# Patient Record
Sex: Female | Born: 1990
Health system: Southern US, Community
[De-identification: ages and names within clinical notes are randomized; demographics above are authoritative.]

## PROBLEM LIST (undated history)

## (undated) DIAGNOSIS — E282 Polycystic ovarian syndrome: Secondary | ICD-10-CM

## (undated) HISTORY — PX: CHOLECYSTECTOMY: SHX55

## (undated) HISTORY — DX: Polycystic ovarian syndrome: E28.2

---

## 2016-05-11 ENCOUNTER — Other Ambulatory Visit: Payer: Self-pay | Admitting: Obstetrics & Gynecology

## 2016-05-11 ENCOUNTER — Other Ambulatory Visit (HOSPITAL_COMMUNITY)
Admission: RE | Admit: 2016-05-11 | Discharge: 2016-05-11 | Disposition: A | Discharge status: Home / Self Care | Payer: BLUE CROSS/BLUE SHIELD | Source: Ambulatory Visit | Attending: Obstetrics and Gynecology | Admitting: Obstetrics and Gynecology

## 2016-05-11 DIAGNOSIS — Z01411 Encounter for gynecological examination (general) (routine) with abnormal findings: Secondary | ICD-10-CM | POA: Diagnosis present

## 2016-05-14 LAB — CYTOLOGY - PAP

## 2016-07-02 ENCOUNTER — Encounter: Payer: Self-pay | Admitting: *Deleted

## 2016-07-02 ENCOUNTER — Encounter: Payer: BLUE CROSS/BLUE SHIELD | Attending: Obstetrics & Gynecology | Admitting: *Deleted

## 2016-07-02 DIAGNOSIS — E282 Polycystic ovarian syndrome: Secondary | ICD-10-CM | POA: Insufficient documentation

## 2016-07-02 NOTE — Progress Notes (Signed)
  Medical Nutrition Therapy:  Appt start time: 1030 end time:  1130.   Assessment:  Primary concerns today: Ariel Nelson is here for nutrition counseling pertaining to referral for obesity and PCOS.  Ariel Nelson states she doesn't have any specific concerns as she has been a "successful dieter" in the past.  However, feels like she is a "stress eater" and gains weight when she is engaged in those behaviors.  Is able to "lose weight when she wants to."  Has never been to a dietitian before.  Has PCOS and was recommended to decrease carbs, but doesn't have much understanding about the disorder.   She does the grocery shopping sometimes.  She lives with her parents, who are Hispanic and mom does the cooking.  Mom typically fries food. 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  She doesn't eat meat much, but eats most other foods (except tomatoes and celery).  When at home, she typically ate at her desk while watching tv or doing homework, but is trying to eat more at the table without distractions. She can be a fast eater.   Craves carbs.     Preferred Learning Style:   No preference indicated   Learning Readiness:   Contemplating   MEDICATIONS: OCP and Synthroid.   No Metformin prescribed   DIETARY INTAKE:  Usual eating pattern includes 2 meals and 2-3 snacks per day. 24-hr recall:  B ( AM): typically skips.  Sometimes 2 slices ww bread and cheese with antibiotics lately.   Snk ( AM): none  L ( PM): grilled chicken breast with vegetables Snk ( PM): cucumbers  D ( PM): salad, sometimes chicken Snk ( PM): none Beverages: water, doesn't like soda  Usual physical activity: not much currently- is in school currently  (but classes are online    Nutritional Diagnosis:  [place]-2.1 Inpaired nutrition utilization As related to carbohydrates.  As evidenced by PCOS.    Intervention:  Nutrition counseling provided.  Discussed physiology of carbohydrate metabolism and how it is affected by  PCOS.  Discussed hormonal imbalances associated with PCOS (namely insulin resistance) and how those imbalances present themselves with hirsutism, body acne, menstrual irregularity, weight gain, and poor glycemic control.  Dicussed the importance of nutrition management for overall health.   Discussed HAES approach and stressed importance of adequate nutrition.  Discussed metabolic effects of inadequate nutrition and how weight management is related to hormone levels, not necessarily lifestyle behaviors.  Recommended talking with medical provider about adequate medication treatment of PCOS.  Mentioned supplemental treatment with DHA.  Recommended adequate calories and adequate protein.  Discussed importance of self-care, including sleep hygiene and gentle body movement   Teaching Method Utilized: Auditory   Handouts given during visit include:  Food Peace and PCOS  Barriers to learning/adherence to lifestyle change: living with parents, time for exercise  Demonstrated degree of understanding via:  Teach Back   Monitoring/Evaluation:  Dietary intake, exercise, labs, and body weight prn.

## 2016-07-02 NOTE — Patient Instructions (Signed)
Aim for 3 meals each day and avoid meal skipping Increase protein with all meals Aim for adequate sleep Increase movement (walking and maybe weight lifting) Talk to doctor about Metformin.  Take with food,  Start at low dose and increase very slowly (over 2 week).  May need very high dose in the end..  Also ask about Spinolactone for hair Try the fish oil Increase fiber from whole grains, vegetables, fruits, beans, nuts, etc

## 2018-05-12 DIAGNOSIS — L2084 Intrinsic (allergic) eczema: Secondary | ICD-10-CM | POA: Diagnosis not present

## 2018-05-12 DIAGNOSIS — D2239 Melanocytic nevi of other parts of face: Secondary | ICD-10-CM | POA: Diagnosis not present

## 2018-05-12 DIAGNOSIS — L304 Erythema intertrigo: Secondary | ICD-10-CM | POA: Diagnosis not present

## 2018-07-18 DIAGNOSIS — K219 Gastro-esophageal reflux disease without esophagitis: Secondary | ICD-10-CM | POA: Diagnosis not present

## 2018-09-15 DIAGNOSIS — E282 Polycystic ovarian syndrome: Secondary | ICD-10-CM | POA: Diagnosis not present

## 2018-09-15 DIAGNOSIS — L659 Nonscarring hair loss, unspecified: Secondary | ICD-10-CM | POA: Diagnosis not present

## 2018-09-15 DIAGNOSIS — N9489 Other specified conditions associated with female genital organs and menstrual cycle: Secondary | ICD-10-CM | POA: Diagnosis not present

## 2018-11-18 DIAGNOSIS — R300 Dysuria: Secondary | ICD-10-CM | POA: Diagnosis not present

## 2018-11-18 DIAGNOSIS — N3001 Acute cystitis with hematuria: Secondary | ICD-10-CM | POA: Diagnosis not present

## 2018-12-16 DIAGNOSIS — N3000 Acute cystitis without hematuria: Secondary | ICD-10-CM | POA: Diagnosis not present

## 2018-12-16 DIAGNOSIS — B373 Candidiasis of vulva and vagina: Secondary | ICD-10-CM | POA: Diagnosis not present

## 2019-02-13 ENCOUNTER — Encounter (HOSPITAL_COMMUNITY): Payer: Self-pay | Admitting: Emergency Medicine

## 2019-02-13 ENCOUNTER — Other Ambulatory Visit: Payer: Self-pay

## 2019-02-13 ENCOUNTER — Ambulatory Visit (HOSPITAL_COMMUNITY)
Admission: EM | Admit: 2019-02-13 | Discharge: 2019-02-13 | Disposition: A | Discharge status: Home / Self Care | Payer: BLUE CROSS/BLUE SHIELD | Attending: Family Medicine | Admitting: Family Medicine

## 2019-02-13 ENCOUNTER — Ambulatory Visit (INDEPENDENT_AMBULATORY_CARE_PROVIDER_SITE_OTHER): Payer: BLUE CROSS/BLUE SHIELD

## 2019-02-13 DIAGNOSIS — X501XXA Overexertion from prolonged static or awkward postures, initial encounter: Secondary | ICD-10-CM | POA: Diagnosis not present

## 2019-02-13 DIAGNOSIS — W108XXA Fall (on) (from) other stairs and steps, initial encounter: Secondary | ICD-10-CM

## 2019-02-13 DIAGNOSIS — S99912A Unspecified injury of left ankle, initial encounter: Secondary | ICD-10-CM

## 2019-02-13 DIAGNOSIS — S8255XA Nondisplaced fracture of medial malleolus of left tibia, initial encounter for closed fracture: Secondary | ICD-10-CM

## 2019-02-13 NOTE — ED Provider Notes (Addendum)
[place]    CSN: [id number] Arrival date & time: 02/13/19  1714     History   Chief Complaint Chief Complaint  Patient presents with  . Ankle Pain    HPI Ariel Nelson is a 28 y.o. female.    The patient presented to the [facility] with a complaint of left ankle pain and swelling secondary to rolling her ankle walking down steps earlier today.  Patient heard a "pop" when ankle twisted and she has pain medially  Pain is not severe     Past Medical History:  Diagnosis Date  . PCOS (polycystic ovarian syndrome)     There are no active problems to display for this patient.   Past Surgical History:  Procedure Laterality Date  . CHOLECYSTECTOMY      OB History   No obstetric history on file.      Home Medications    Prior to Admission medications   Medication Sig Start Date End Date Taking? Authorizing Provider  drospirenone-ethinyl estradiol (YAZ,GIANVI,LORYNA) 3-0.02 MG tablet Take 1 tablet by mouth daily.   Yes [provider]  levothyroxine (SYNTHROID, LEVOTHROID) 100 MCG tablet Take 100 mcg by mouth daily before breakfast.   Yes [provider]    Family History Family History  Problem Relation Age of Onset  . Diabetes Father   . Hypertension Other   . Cancer Other     Social History Social History   Tobacco Use  . Smoking status: Not on file  Substance Use Topics  . Alcohol use: Not on file  . Drug use: Not on file     Allergies   Levaquin [levofloxacin]   Review of Systems Review of Systems   Physical Exam Triage Vital Signs ED Triage Vitals  Enc Vitals Group     BP 02/13/19 1757 (!) 152/69     Pulse Rate 02/13/19 1757 (!) 101     Resp 02/13/19 1757 20     Temp 02/13/19 1757 98.2 F (36.8 C)     Temp Source 02/13/19 1757 Oral     SpO2 02/13/19 1757 99 %     Weight --      Height --      Head Circumference --      Peak Flow --      Pain Score 02/13/19 1759 2     Pain Loc --      Pain Edu? --    Excl. in [address]? --    No data found.  Updated Vital Signs BP (!) 152/69 (BP Location: Left Arm)   Pulse (!) 101   Temp 98.2 F (36.8 C) (Oral)   Resp 20   LMP 01/17/2019 (Exact Date)   SpO2 99%    Physical Exam Vitals signs and nursing note reviewed.  Constitutional:      Appearance: Normal appearance. She is obese.  HENT:     Head: Normocephalic and atraumatic.  Pulmonary:     Effort: Pulmonary effort is normal.  Musculoskeletal:        General: Swelling present.     Comments: Tender and swollen, ecchymotic medial malleolus of left ankle  Skin:    General: Skin is warm and dry.  Neurological:     General: No focal deficit present.     Mental Status: She is alert.  Psychiatric:        Mood and Affect: Mood normal.      UC Treatments / Results  Labs (all labs ordered are listed,  but only abnormal results are displayed) Labs Reviewed - No data to display  EKG None  Radiology No results found.  Procedures Procedures (including critical care time)  Medications Ordered in UC Medications - No data to display  Initial Impression / Assessment and Plan / UC Course  I have reviewed the triage vital signs and the nursing notes.  Pertinent labs & imaging results that were available during my care of the patient were reviewed by me and considered in my medical decision making (see chart for details).    Final Clinical Impressions(s) / UC Diagnoses   Final diagnoses:  Closed nondisplaced fracture of medial malleolus of left tibia, initial encounter     Discharge Instructions     Follow up with your orthopedic surgeon next week  Keep the Cam walker on whenever walking or bearing weight.    ED Prescriptions    None     Controlled Substance Prescriptions [place] Controlled Substance Registry consulted? Not Applicable   Robyn Haber, MD 02/13/19 De Nurse, MD 02/13/19 1900

## 2019-02-13 NOTE — Discharge Instructions (Addendum)
Follow up with your orthopedic surgeon next week  Keep the Cam walker on whenever walking or bearing weight.

## 2019-02-13 NOTE — ED Triage Notes (Signed)
The patient presented to the [facility] with a complaint of left ankle pain and swelling secondary to rolling her ankle walking down steps earlier today.

## 2019-10-14 ENCOUNTER — Other Ambulatory Visit: Payer: Self-pay

## 2019-10-14 DIAGNOSIS — Z20822 Contact with and (suspected) exposure to covid-19: Secondary | ICD-10-CM

## 2019-10-15 LAB — NOVEL CORONAVIRUS, NAA: SARS-CoV-2, NAA: NOT DETECTED

## 2022-02-08 ENCOUNTER — Other Ambulatory Visit: Payer: Self-pay | Admitting: Family Medicine

## 2022-02-08 DIAGNOSIS — R7401 Elevation of levels of liver transaminase levels: Secondary | ICD-10-CM

## 2022-03-20 ENCOUNTER — Ambulatory Visit
Admission: RE | Admit: 2022-03-20 | Discharge: 2022-03-20 | Disposition: A | Discharge status: Home / Self Care | Payer: Managed Care, Other (non HMO) | Source: Ambulatory Visit | Attending: Family Medicine | Admitting: Family Medicine

## 2022-03-20 DIAGNOSIS — R7401 Elevation of levels of liver transaminase levels: Secondary | ICD-10-CM

## 2022-06-22 DIAGNOSIS — E559 Vitamin D deficiency, unspecified: Secondary | ICD-10-CM | POA: Diagnosis not present

## 2022-06-22 DIAGNOSIS — N760 Acute vaginitis: Secondary | ICD-10-CM | POA: Diagnosis not present

## 2022-06-22 DIAGNOSIS — Z113 Encounter for screening for infections with a predominantly sexual mode of transmission: Secondary | ICD-10-CM | POA: Diagnosis not present

## 2022-06-25 DIAGNOSIS — H33321 Round hole, right eye: Secondary | ICD-10-CM | POA: Diagnosis not present

## 2022-07-18 DIAGNOSIS — H31091 Other chorioretinal scars, right eye: Secondary | ICD-10-CM | POA: Diagnosis not present

## 2022-07-18 DIAGNOSIS — H35411 Lattice degeneration of retina, right eye: Secondary | ICD-10-CM | POA: Diagnosis not present

## 2022-08-03 DIAGNOSIS — M25561 Pain in right knee: Secondary | ICD-10-CM | POA: Diagnosis not present

## 2022-09-28 DIAGNOSIS — E782 Mixed hyperlipidemia: Secondary | ICD-10-CM | POA: Diagnosis not present

## 2022-09-28 DIAGNOSIS — E039 Hypothyroidism, unspecified: Secondary | ICD-10-CM | POA: Diagnosis not present

## 2022-09-28 DIAGNOSIS — E559 Vitamin D deficiency, unspecified: Secondary | ICD-10-CM | POA: Diagnosis not present

## 2022-09-28 DIAGNOSIS — E8881 Metabolic syndrome: Secondary | ICD-10-CM | POA: Diagnosis not present

## 2022-09-28 DIAGNOSIS — Z0000 Encounter for general adult medical examination without abnormal findings: Secondary | ICD-10-CM | POA: Diagnosis not present

## 2023-02-19 DIAGNOSIS — M9420 Chondromalacia, unspecified site: Secondary | ICD-10-CM | POA: Diagnosis not present

## 2023-02-19 DIAGNOSIS — E039 Hypothyroidism, unspecified: Secondary | ICD-10-CM | POA: Diagnosis not present

## 2023-02-19 DIAGNOSIS — M5430 Sciatica, unspecified side: Secondary | ICD-10-CM | POA: Diagnosis not present

## 2023-02-19 DIAGNOSIS — L259 Unspecified contact dermatitis, unspecified cause: Secondary | ICD-10-CM | POA: Diagnosis not present

## 2023-02-19 DIAGNOSIS — M2550 Pain in unspecified joint: Secondary | ICD-10-CM | POA: Diagnosis not present

## 2023-03-15 DIAGNOSIS — Z113 Encounter for screening for infections with a predominantly sexual mode of transmission: Secondary | ICD-10-CM | POA: Diagnosis not present

## 2023-03-15 DIAGNOSIS — Z23 Encounter for immunization: Secondary | ICD-10-CM | POA: Diagnosis not present

## 2023-03-15 DIAGNOSIS — Z01419 Encounter for gynecological examination (general) (routine) without abnormal findings: Secondary | ICD-10-CM | POA: Diagnosis not present

## 2023-05-17 DIAGNOSIS — Z23 Encounter for immunization: Secondary | ICD-10-CM | POA: Diagnosis not present

## 2023-08-25 IMAGING — US US ABDOMEN LIMITED
1 series · 14 of 25 positions shown · non-contrast
Comparison: None.

CLINICAL DATA: Elevated transaminase level. Status post
cholecystectomy.

EXAM:
ULTRASOUND ABDOMEN LIMITED RIGHT UPPER QUADRANT

[Series 1: us abdomen limited · 0.28mm/px · 14 of 32 slices shown]
[im 1/32]
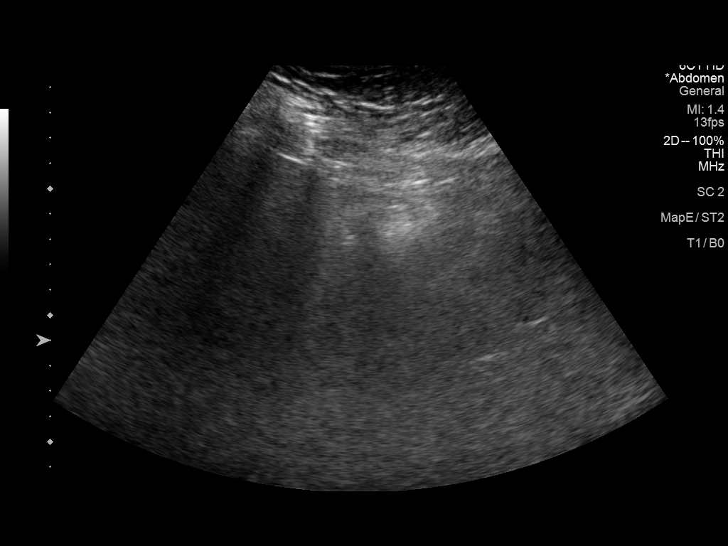
[im 3/32]
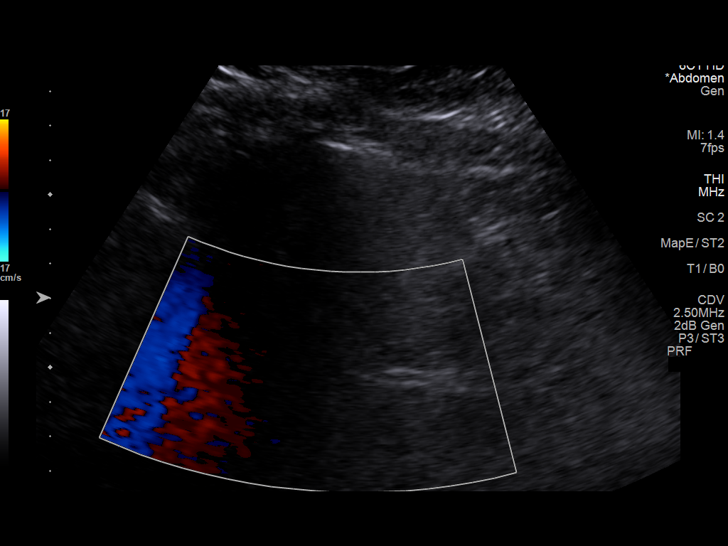
[im 6/32]
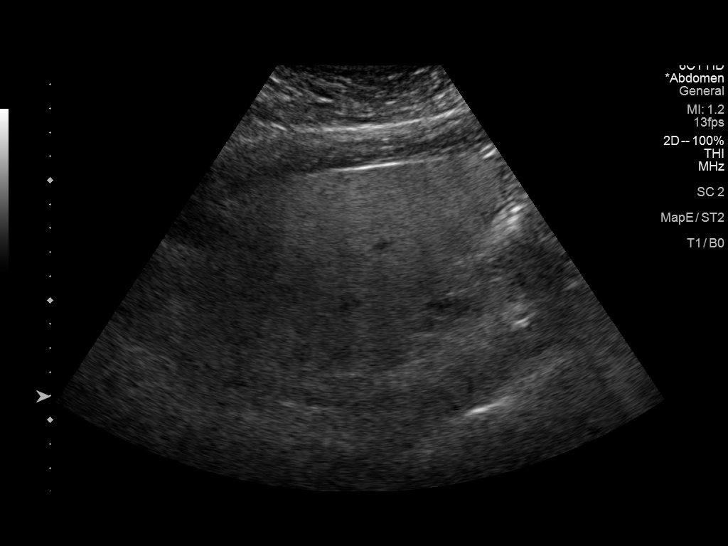
[im 8/32]
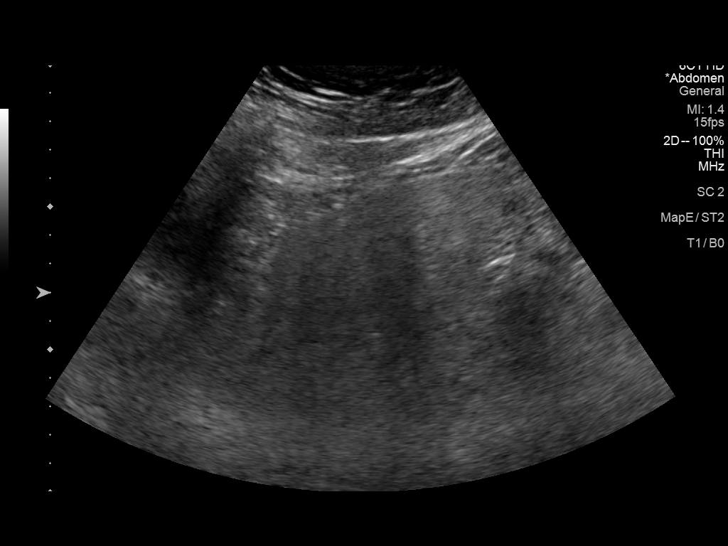
[im 11/32]
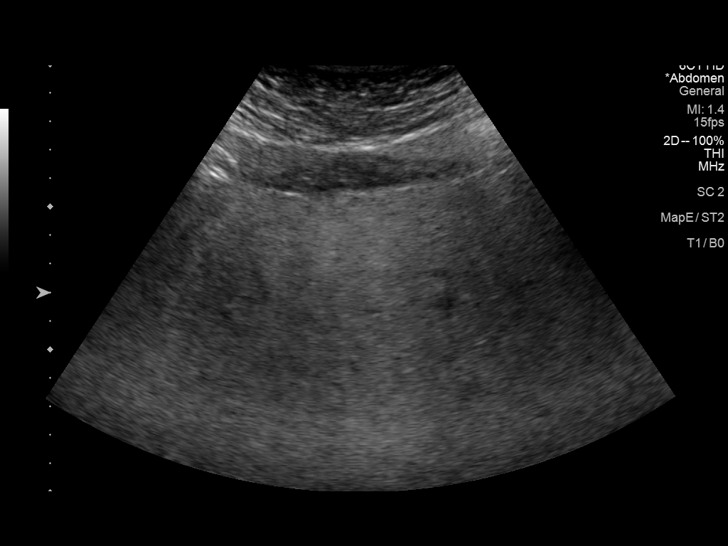
[im 12/32]
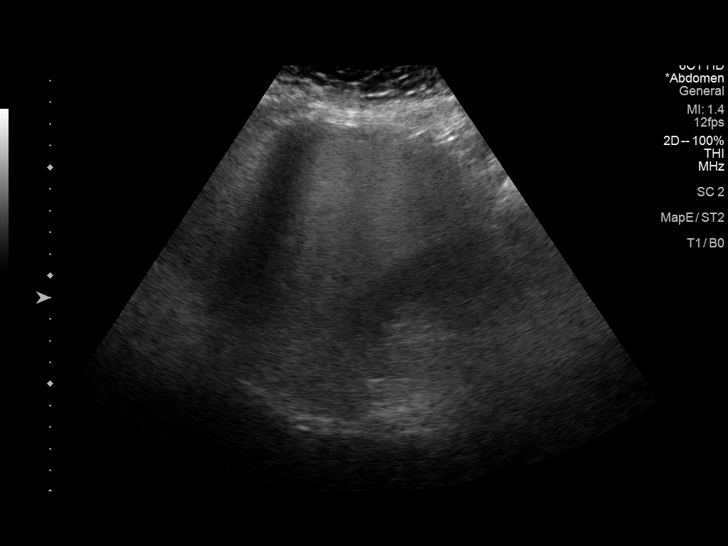
[im 15/32]
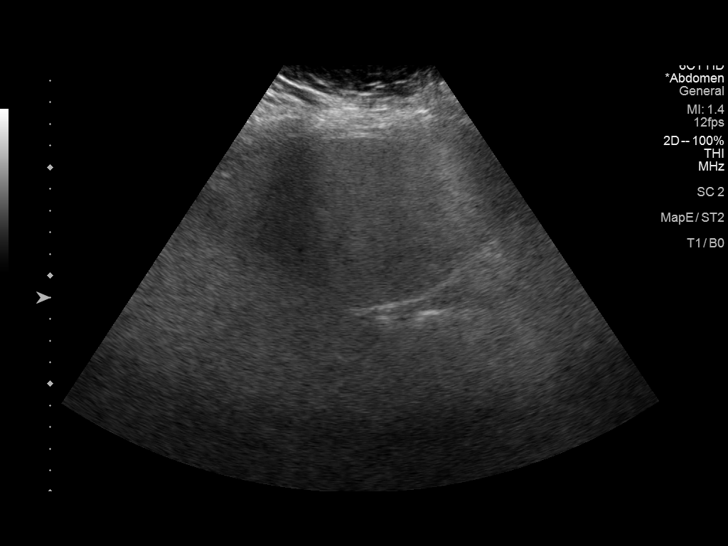
[im 17/32]
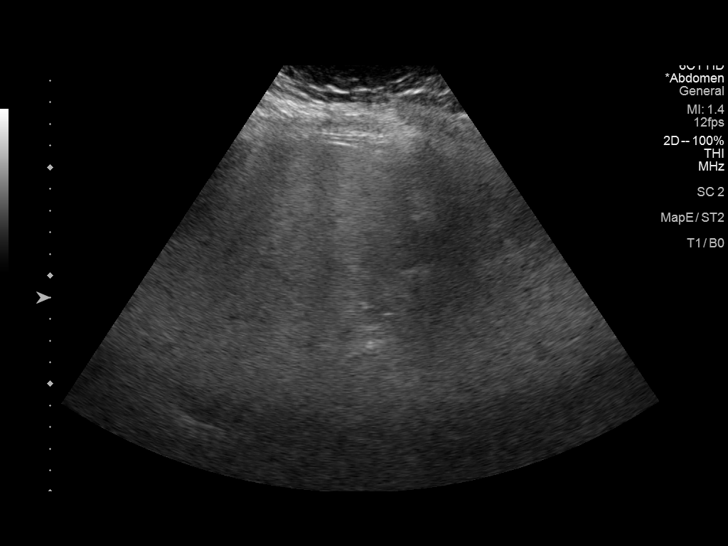
[im 20/32]
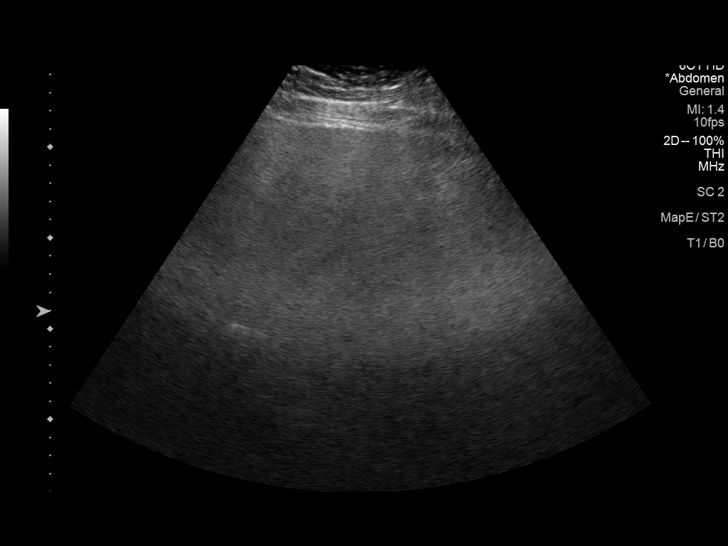
[im 21/32]
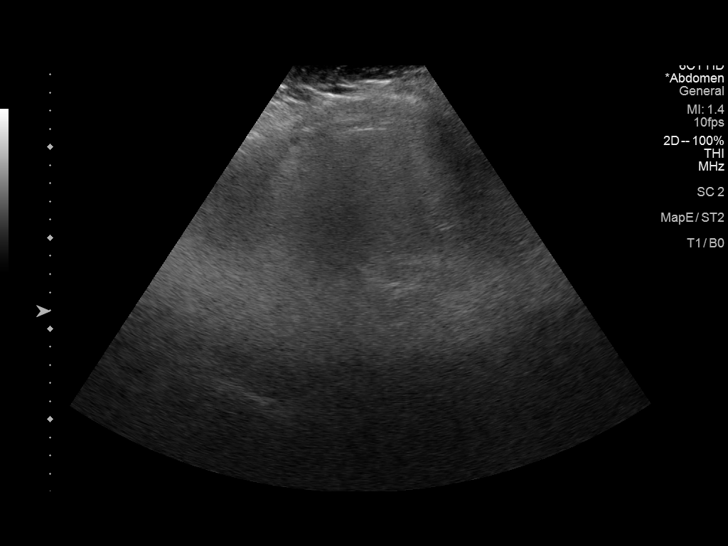
[im 24/32]
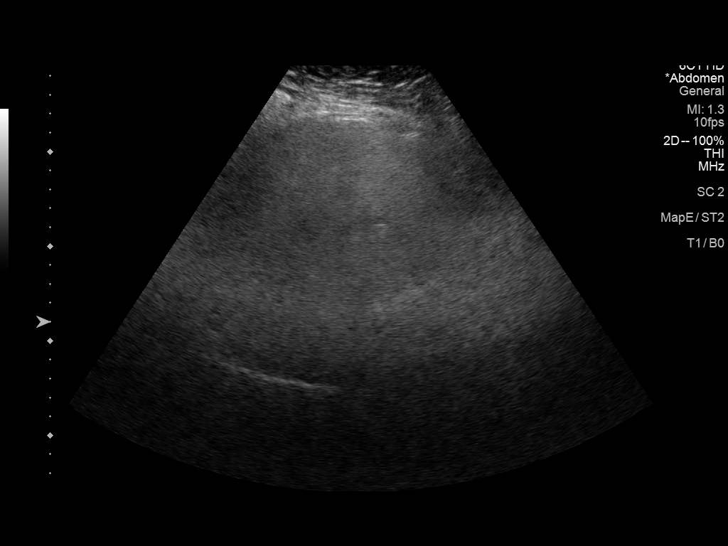
[im 26/32]
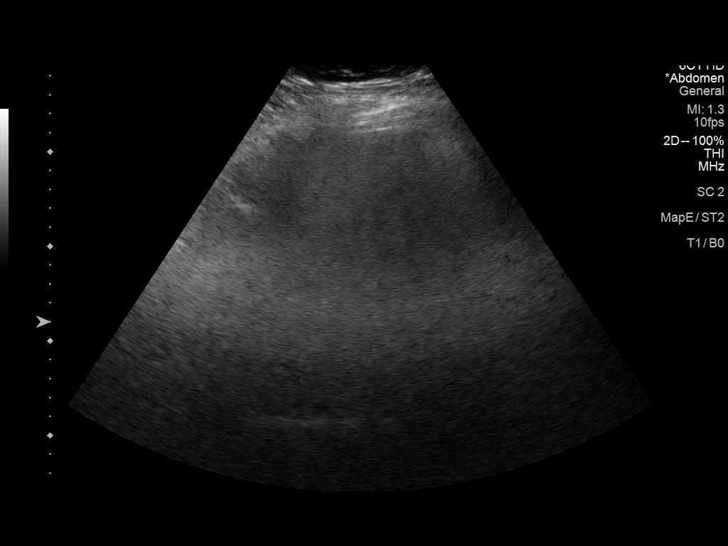
[im 29/32]
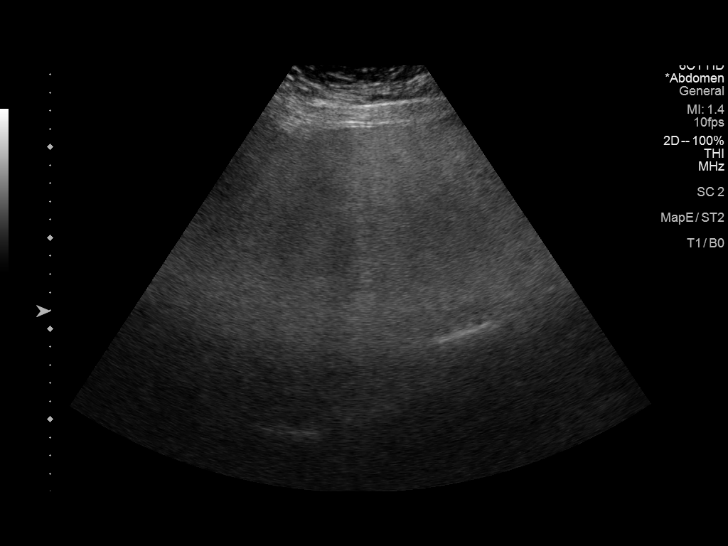
[im 32/32]
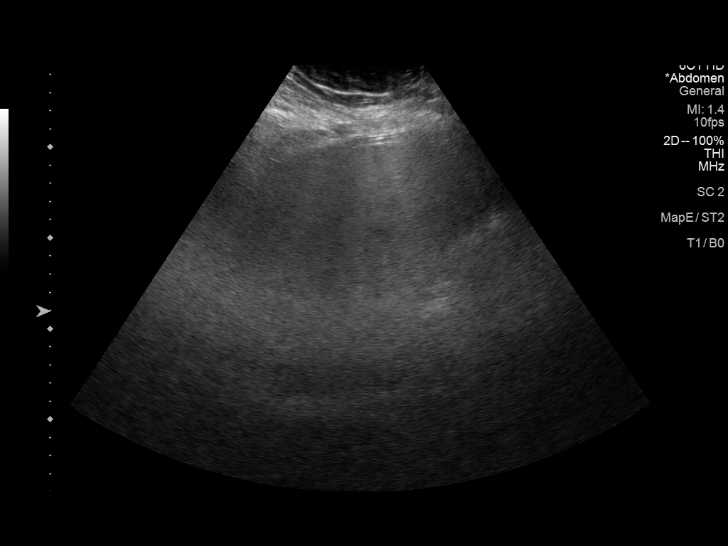

[14 of 25 positions shown; findings below may reference images not displayed]

FINDINGS: Gallbladder:

Status post cholecystectomy.

Common bile duct:

Diameter: 4 mm.

Liver:

No focal lesion identified. Marked increased parenchymal
echogenicity. Portal vein is patent on color Doppler imaging with
normal direction of blood flow towards the liver.

Other: None.
IMPRESSION: 1. Marked hepatic steatosis. Please note limited evaluation for
focal hepatic masses in a patient with hepatic steatosis due to
decreased penetration of the acoustic ultrasound waves.
2. Status post cholecystectomy.

## 2023-09-06 DIAGNOSIS — K219 Gastro-esophageal reflux disease without esophagitis: Secondary | ICD-10-CM | POA: Diagnosis not present

## 2023-09-13 DIAGNOSIS — Z23 Encounter for immunization: Secondary | ICD-10-CM | POA: Diagnosis not present

## 2023-12-20 DIAGNOSIS — K219 Gastro-esophageal reflux disease without esophagitis: Secondary | ICD-10-CM | POA: Diagnosis not present

## 2023-12-20 DIAGNOSIS — R1032 Left lower quadrant pain: Secondary | ICD-10-CM | POA: Diagnosis not present

## 2024-02-28 DIAGNOSIS — R7303 Prediabetes: Secondary | ICD-10-CM | POA: Diagnosis not present

## 2024-02-28 DIAGNOSIS — E039 Hypothyroidism, unspecified: Secondary | ICD-10-CM | POA: Diagnosis not present

## 2024-03-20 DIAGNOSIS — Z01419 Encounter for gynecological examination (general) (routine) without abnormal findings: Secondary | ICD-10-CM | POA: Diagnosis not present

## 2024-03-20 DIAGNOSIS — Z1151 Encounter for screening for human papillomavirus (HPV): Secondary | ICD-10-CM | POA: Diagnosis not present

## 2024-03-20 DIAGNOSIS — Z124 Encounter for screening for malignant neoplasm of cervix: Secondary | ICD-10-CM | POA: Diagnosis not present

## 2024-03-20 DIAGNOSIS — N898 Other specified noninflammatory disorders of vagina: Secondary | ICD-10-CM | POA: Diagnosis not present

## 2024-03-30 DIAGNOSIS — H35411 Lattice degeneration of retina, right eye: Secondary | ICD-10-CM | POA: Diagnosis not present

## 2024-03-30 DIAGNOSIS — H43823 Vitreomacular adhesion, bilateral: Secondary | ICD-10-CM | POA: Diagnosis not present

## 2024-04-09 DIAGNOSIS — F419 Anxiety disorder, unspecified: Secondary | ICD-10-CM | POA: Diagnosis not present

## 2024-04-09 DIAGNOSIS — F338 Other recurrent depressive disorders: Secondary | ICD-10-CM | POA: Diagnosis not present

## 2024-04-09 DIAGNOSIS — Z79899 Other long term (current) drug therapy: Secondary | ICD-10-CM | POA: Diagnosis not present

## 2024-04-09 DIAGNOSIS — Z5181 Encounter for therapeutic drug level monitoring: Secondary | ICD-10-CM | POA: Diagnosis not present

## 2024-04-17 DIAGNOSIS — E039 Hypothyroidism, unspecified: Secondary | ICD-10-CM | POA: Diagnosis not present

## 2024-05-22 DIAGNOSIS — F419 Anxiety disorder, unspecified: Secondary | ICD-10-CM | POA: Diagnosis not present

## 2024-05-22 DIAGNOSIS — F338 Other recurrent depressive disorders: Secondary | ICD-10-CM | POA: Diagnosis not present

## 2024-06-01 DIAGNOSIS — H31091 Other chorioretinal scars, right eye: Secondary | ICD-10-CM | POA: Diagnosis not present

## 2024-06-01 DIAGNOSIS — H43823 Vitreomacular adhesion, bilateral: Secondary | ICD-10-CM | POA: Diagnosis not present

## 2024-06-01 DIAGNOSIS — H35411 Lattice degeneration of retina, right eye: Secondary | ICD-10-CM | POA: Diagnosis not present

## 2024-08-21 DIAGNOSIS — K219 Gastro-esophageal reflux disease without esophagitis: Secondary | ICD-10-CM | POA: Diagnosis not present

## 2024-08-21 DIAGNOSIS — R1012 Left upper quadrant pain: Secondary | ICD-10-CM | POA: Diagnosis not present

## 2024-09-04 DIAGNOSIS — F338 Other recurrent depressive disorders: Secondary | ICD-10-CM | POA: Diagnosis not present

## 2024-09-04 DIAGNOSIS — F419 Anxiety disorder, unspecified: Secondary | ICD-10-CM | POA: Diagnosis not present
